# Patient Record
Sex: Female | Born: 2005 | Race: Black or African American | Hispanic: No | Marital: Single | Smoking: Never smoker
Health system: Southern US, Community
[De-identification: ages and names within clinical notes are randomized; demographics above are authoritative.]

## PROBLEM LIST (undated history)

## (undated) DIAGNOSIS — Z789 Other specified health status: Secondary | ICD-10-CM

## (undated) HISTORY — DX: Other specified health status: Z78.9

## (undated) HISTORY — PX: NO PAST SURGERIES: SHX2092

## (undated) HISTORY — PX: WISDOM TOOTH EXTRACTION: SHX21

---
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
## 2024-02-18 ENCOUNTER — Other Ambulatory Visit (HOSPITAL_COMMUNITY)
Admission: RE | Admit: 2024-02-18 | Discharge: 2024-02-18 | Disposition: A | Discharge status: Home / Self Care | Source: Ambulatory Visit | Attending: Adult Health | Admitting: Adult Health

## 2024-02-18 ENCOUNTER — Ambulatory Visit: Admitting: Adult Health

## 2024-02-18 ENCOUNTER — Encounter: Payer: Self-pay | Admitting: Adult Health

## 2024-02-18 VITALS — BP 110/70 | HR 83 | Ht 64.0 in | Wt 109.5 lb

## 2024-02-18 DIAGNOSIS — Z3202 Encounter for pregnancy test, result negative: Secondary | ICD-10-CM

## 2024-02-18 DIAGNOSIS — Z113 Encounter for screening for infections with a predominantly sexual mode of transmission: Secondary | ICD-10-CM

## 2024-02-18 DIAGNOSIS — N898 Other specified noninflammatory disorders of vagina: Secondary | ICD-10-CM | POA: Insufficient documentation

## 2024-02-18 DIAGNOSIS — N926 Irregular menstruation, unspecified: Secondary | ICD-10-CM

## 2024-02-18 DIAGNOSIS — R82998 Other abnormal findings in urine: Secondary | ICD-10-CM | POA: Diagnosis not present

## 2024-02-18 DIAGNOSIS — Z975 Presence of (intrauterine) contraceptive device: Secondary | ICD-10-CM

## 2024-02-18 LAB — POCT URINE PREGNANCY: Preg Test, Ur: NEGATIVE

## 2024-02-18 MED ORDER — MEGESTROL ACETATE 40 MG PO TABS: ORAL_TABLET | ORAL | 1 refills | Status: AC

## 2024-02-18 NOTE — Progress Notes (Signed)
  Subjective:     Patient ID: Andrea Mcdowell, female   DOB: Aug 29, 2005, 18 y.o.   MRN: [id number]  HPI Andrea Mcdowell is a 18 year old black female,single, G1P1001, in complaining of irregular bleeding, on and off, like every other week, she has nexplanon  and requests megace , it worked in the past. She also says urine is dark. She requests to do self swab too.   Review of Systems +irregular bleeding, on and off, like every other week, she has nexplanon  and requests megace , it worked in the past. +urine is dark Feels irritated  Reviewed past medical,surgical, social and family history. Reviewed medications and allergies.     Objective:   Physical Exam BP 110/70 (BP Location: Right Arm, Patient Position: Sitting, Cuff Size: Normal)   Pulse 83   Ht 5' 4 (1.626 m)   Wt 109 lb 8 oz (49.7 kg)   LMP 02/14/2024 (Approximate)   BMI 18.80 kg/m  UPT is negative, urine dipstick trace blood and protein and is dark Skin warm and dry.  Lungs: clear to ausculation bilaterally. Cardiovascular: regular rate and rhythm.  She declines exam and did self swab Fall risk Is low  Upstream - 02/18/24 0916       Pregnancy Intention Screening   Does the patient want to become pregnant in the next year? No    Does the patient's partner want to become pregnant in the next year? No    Would the patient like to discuss contraceptive options today? No      Contraception Wrap Up   Current Method Hormonal Implant    End Method Hormonal Implant    Contraception Counseling Provided Yes             Assessment:     1. Screening examination for STD (sexually transmitted disease) CV swab sent for GC/CHL,trich,BV and yeast  - Cervicovaginal ancillary only( [place])  2. Pregnancy examination or test, negative result - POCT urine pregnancy  3. Vaginal irritation CV swab sent  - Cervicovaginal ancillary only( [place])  4. Dark urine UA C&S sent to rule out UTI  5. Nexplanon  in place Placed 01/19/22 in  hospital after delivery   6. Irregular bleeding (Primary) Bleeding on and off, like every other week, requests megace , it worked in past Meds ordered this encounter  Medications   megestrol  (MEGACE ) 40 MG tablet    Sig: TAKE 3 TABLETS EVERY DAY X 5 DAYS THEN 2 TABLETS EVERY DAY X 5 DAYS, THEN 1 TABLET EVERY DAY TILL BLEEDING STOPS    Dispense:  45 tablet    Refill:  1    Supervising Provider:   JAYNE VONN DEL [[id number]]       Plan:     Follow up prn

## 2024-02-18 NOTE — Addendum Note (Signed)
 Addended by: NEYSA CLARITA RAMAN on: 02/18/2024 09:55 AM   Modules accepted: Orders

## 2024-02-19 LAB — URINALYSIS, ROUTINE W REFLEX MICROSCOPIC
Bilirubin, UA: NEGATIVE
Glucose, UA: NEGATIVE
Ketones, UA: NEGATIVE
Leukocytes,UA: NEGATIVE
Nitrite, UA: NEGATIVE
RBC, UA: NEGATIVE
Specific Gravity, UA: >=1.03 — AB (ref 1.005–1.030)
Urobilinogen, Ur: 1 mg/dL (ref 0.2–1.0)
pH, UA: 6.5 (ref 5.0–7.5)

## 2024-02-20 LAB — URINE CULTURE

## 2024-02-21 ENCOUNTER — Ambulatory Visit: Payer: Self-pay | Admitting: Adult Health

## 2024-02-22 LAB — CERVICOVAGINAL ANCILLARY ONLY
Bacterial Vaginitis (gardnerella): POSITIVE — AB
Candida Glabrata: NEGATIVE
Candida Vaginitis: NEGATIVE
Chlamydia: NEGATIVE
Comment: NEGATIVE
Comment: NEGATIVE
Comment: NEGATIVE
Comment: NEGATIVE
Comment: NEGATIVE
Comment: NORMAL
Neisseria Gonorrhea: NEGATIVE
Trichomonas: NEGATIVE

## 2024-02-22 MED ORDER — METRONIDAZOLE 500 MG PO TABS: 500.0000 mg | ORAL_TABLET | Freq: Two times a day (BID) | ORAL | 0 refills | Status: AC

## 2024-05-22 ENCOUNTER — Telehealth: Admitting: Physician Assistant

## 2024-05-22 DIAGNOSIS — R3989 Other symptoms and signs involving the genitourinary system: Secondary | ICD-10-CM | POA: Diagnosis not present

## 2024-05-22 MED ORDER — NITROFURANTOIN MONOHYD MACRO 100 MG PO CAPS: 100.0000 mg | ORAL_CAPSULE | Freq: Two times a day (BID) | ORAL | 0 refills | Status: AC

## 2024-05-22 NOTE — Progress Notes (Signed)
